# Patient Record
Sex: Male | Born: 1980 | Race: White | Hispanic: No | Marital: Married | Smoking: Never smoker
Health system: Southern US, Community
[De-identification: ages and names within clinical notes are randomized; demographics above are authoritative.]

## PROBLEM LIST (undated history)

## (undated) DIAGNOSIS — M542 Cervicalgia: Secondary | ICD-10-CM

## (undated) HISTORY — DX: Cervicalgia: M54.2

## (undated) HISTORY — PX: NO PAST SURGERIES: SHX2092

---

## 2016-09-13 ENCOUNTER — Encounter: Payer: Self-pay | Admitting: Physician Assistant

## 2016-09-13 ENCOUNTER — Ambulatory Visit (INDEPENDENT_AMBULATORY_CARE_PROVIDER_SITE_OTHER): Payer: 59 | Admitting: Physician Assistant

## 2016-09-13 ENCOUNTER — Ambulatory Visit (INDEPENDENT_AMBULATORY_CARE_PROVIDER_SITE_OTHER): Payer: 59

## 2016-09-13 VITALS — BP 127/84 | HR 80 | Ht 74.0 in | Wt 221.0 lb

## 2016-09-13 DIAGNOSIS — M25522 Pain in left elbow: Secondary | ICD-10-CM | POA: Insufficient documentation

## 2016-09-13 DIAGNOSIS — M542 Cervicalgia: Secondary | ICD-10-CM | POA: Diagnosis not present

## 2016-09-13 DIAGNOSIS — M5412 Radiculopathy, cervical region: Secondary | ICD-10-CM

## 2016-09-13 DIAGNOSIS — Z0000 Encounter for general adult medical examination without abnormal findings: Secondary | ICD-10-CM | POA: Diagnosis not present

## 2016-09-13 MED ORDER — PREDNISONE 50 MG PO TABS: ORAL_TABLET | ORAL | 0 refills | Status: DC

## 2016-09-13 MED ORDER — MELOXICAM 15 MG PO TABS: 15.0000 mg | ORAL_TABLET | Freq: Every day | ORAL | 0 refills | Status: DC

## 2016-09-13 NOTE — Patient Instructions (Signed)
I have ordered fasting labs (nothing to eat or drink except water after midnight or at least 8 hours before the blood draw). The lab is open 8a-5p and closed 12:30-1:30. No appointment needed   Cervical Radiculopathy Introduction Cervical radiculopathy happens when a nerve in the neck (cervical nerve) is pinched or bruised. This condition can develop because of an injury or as part of the normal aging process. Pressure on the cervical nerves can cause pain or numbness that runs from the neck all the way down into the arm and fingers. Usually, this condition gets better with rest. Treatment may be needed if the condition does not improve. What are the causes? This condition may be caused by:  Injury.  Slipped (herniated) disk.  Muscle tightness in the neck because of overuse.  Arthritis.  Breakdown or degeneration in the bones and joints of the spine (spondylosis) due to aging.  Bone spurs that may develop near the cervical nerves. What are the signs or symptoms? Symptoms of this condition include:  Pain that runs from the neck to the arm and hand. The pain can be severe or irritating. It may be worse when the neck is moved.  Numbness or weakness in the affected arm and hand. How is this diagnosed? This condition may be diagnosed based on symptoms, medical history, and a physical exam. You may also have tests, including:  X-rays.  CT scan.  MRI.  Electromyogram (EMG).  Nerve conduction tests. How is this treated? In many cases, treatment is not needed for this condition. With rest, the condition usually gets better over time. If treatment is needed, options may include:  Wearing a soft neck collar for short periods of time.  Physical therapy to strengthen your neck muscles.  Medicines, such as NSAIDs, oral corticosteroids, or spinal injections.  Surgery. This may be needed if other treatments do not help. Various types of surgery may be done depending on the cause of  your problems. Follow these instructions at home: Managing pain  Take over-the-counter and prescription medicines only as told by your health care provider.  If directed, apply ice to the affected area.  Put ice in a plastic bag.  Place a towel between your skin and the bag.  Leave the ice on for 20 minutes, 2-3 times per day.  If ice does not help, you can try using heat. Take a warm shower or warm bath, or use a heat pack as told by your health care provider.  Try a gentle neck and shoulder massage to help relieve symptoms. Activity  Rest as needed. Follow instructions from your health care provider about any restrictions on activities.  Do stretching and strengthening exercises as told by your health care provider or physical therapist. General instructions  If you were given a soft collar, wear it as told by your health care provider.  Use a flat pillow when you sleep.  Keep all follow-up visits as told by your health care provider. This is important. Contact a health care provider if:  Your condition does not improve with treatment. Get help right away if:  Your pain gets much worse and cannot be controlled with medicines.  You have weakness or numbness in your hand, arm, face, or leg.  You have a high fever.  You have a stiff, rigid neck.  You lose control of your bowels or your bladder (have incontinence).  You have trouble with walking, balance, or speaking. This information is not intended to replace advice given  to you by your health care provider. Make sure you discuss any questions you have with your health care provider. Document Released: 05/31/2001 Document Revised: 02/11/2016 Document Reviewed: 10/30/2014  2017 Elsevier

## 2016-09-13 NOTE — Progress Notes (Signed)
HPI:                                                                Kenneth Mays is a 35 y.o. male who [facility] today to establish care and c/o neck pain  Hx of elevated BP: has had elevated pressures in the past when visiting the doctors office. Has never taken antihypertensive medication.Denies headache, chest pain, dyspnea, lightheadedness, and edema. Exercises regularly (jiu Jitsu).   Neck pain: mostly left-sided, that is radiating into left shoulder with paresthesias in his left 4th and 5th digits. Started 1 month ago. Reports injury during Jiu Jitsu where his body weight and that of another 200 pound person was entirely on his neck. He has full ROM. Pain is worse with driving and certain positions while lying down.  Describes pain as burning sensation and "someone grabbing his muscles and squeezing as hard as they can." Has tried rest and heat. Ibuprofen provided no relief.    Left elbow pain: states approx. 1 month ago he was in a [employer] hold and felt/heard a loud pop in his elbow followed by pain. Denied any swelling, decreased ROM, or deformity. Continues to have elbow pain with flexion and extension.  Family hx significant for HTN and Diabetes.  Health Maintenance Health Maintenance  Topic Date Due  . HIV Screening  07/15/1996  . TETANUS/TDAP  07/15/2000  . INFLUENZA VACCINE  04/19/2016    Health Habits  Diet: regular  Exercise: yes, Jiu Jitsu  ETOH: no  Tobacco: no  Drugs: no  Dental Exam: up to date  Eye Exam: never  No past medical history on file. No past surgical history on file. Social History  Substance Use Topics  . Smoking status: Never Smoker  . Smokeless tobacco: Not on file  . Alcohol use No   family history is not on file.  Review of Systems  Constitutional: Negative for chills, fever, malaise/fatigue and weight loss.  HENT: Negative.   Eyes: Negative.   Respiratory:  Negative.   Cardiovascular: Negative.   Gastrointestinal: Negative.   Genitourinary: Negative.   Musculoskeletal: Positive for joint pain (left elbow) and neck pain.  Skin: Negative.   Neurological: Positive for tingling (left 4th and 5th digits) and sensory change (left 4th and 5th digits). Negative for focal weakness, loss of consciousness and headaches.  Endo/Heme/Allergies: Negative.   Psychiatric/Behavioral: Negative for depression. The patient is not nervous/anxious.     Medications: Current Outpatient Prescriptions  Medication Sig Dispense Refill  . meloxicam (MOBIC) 15 MG tablet Take 1 tablet (15 mg total) by mouth daily. 30 tablet 0  . predniSONE (DELTASONE) 50 MG tablet One tab PO daily for 5 days. 5 tablet 0   No current facility-administered medications for this visit.    No Known Allergies     Objective:  BP 127/84   Pulse 80   Ht 6\' 2"  (1.88 m)   Wt 221 lb (100.2 kg)   BMI 28.37 kg/m   Gen: Alert, not ill-appearing, no distress HEENT:  normal conjunctiva, TM's clear, oropharynx clear, moist mucus membranes, no thyromegaly or tenderness Lungs: Normal work of breathing, clear to auscultation bilaterally Heart: Normal rate, regular rhythm, s1 and s2 distinct, no murmurs,  clicks or rubs appreciated on this exam Abd: Soft. Nondistended, Nontender Extremities: distal pulses intact, no peripheral edema Neuro: alert and oriented x 3, EOM's intact, PERRLA,  Musculoskeletal: strength 5/5 and symmetric in upper extremities, full ROM of b/l upper extremities, no point tenderness at a/c joint, no spinous process tenderness, left elbow without deformity and with full ROM, normal gait Skin: warm and dry, no rashes or lesions on exposed skin Psych: normal affect, pleasant mood, normal speech   No results found for this or any previous visit (from the past 72 hour(s)). Dg Cervical Spine Complete  Result Date: 09/13/2016 CLINICAL DATA:  Neck pain, no acute injury EXAM:  CERVICAL SPINE - COMPLETE 4+ VIEW COMPARISON:  None. FINDINGS: The cervical vertebrae are in normal alignment. Intervertebral disc spaces appear normal. No prevertebral soft tissue swelling is seen. On oblique views, the foramina are patent. The odontoid process is intact. IMPRESSION: Normal alignment.  Normal intervertebral disc spaces. Electronically Signed   By: Dwyane DeePaul  Barry M.D.   On: 09/13/2016 16:34   Dg Elbow Complete Left (3+view)  Result Date: 09/13/2016 CLINICAL DATA:  Left elbow pain for 2 weeks, no injury EXAM: LEFT ELBOW - COMPLETE 3+ VIEW COMPARISON:  None. FINDINGS: Alignment is normal. No fracture is seen. No joint effusion is noted. There is a small well corticated bony density along the medial aspect of the elbow joint which could be due to prior trauma. IMPRESSION: No fracture. No joint effusion. Possible small avulsion from prior trauma as noted above. Electronically Signed   By: Dwyane DeePaul  Barry M.D.   On: 09/13/2016 16:36      Assessment and Plan: 35 y.o. male with   1. Encounter for preventative adult health care examination - CBC - Comprehensive metabolic panel - Hemoglobin A1c - Lipid panel - TSH - VITAMIN D 25 Hydroxy (Vit-D Deficiency, Fractures)  2. Neck pain with cervical radiculopathy - DG Cervical Spine Complete; Future - meloxicam (MOBIC) 15 MG tablet; Take 1 tablet (15 mg total) by mouth daily. - predniSONE (DELTASONE) 50 MG tablet; One tab PO daily for 5 days - referred to Sports Medicine - instructed to avoid [employer] El[employer]iu Jitsu until imaging studies complete and he has been evaluated by Sports Medicine  3. Left elbow pain - DG ELBOW COMPLETE LEFT (3+VIEW); Future - meloxicam (MOBIC) 15 MG  - predniSONE (DELTASONE) 50 MG  - referred to Sports Medicine  4. Elevated BP - mildly elevated diastolic BP in clinic today - instructed on lifestyle modifications   Patient education and anticipatory guidance given Patient agrees with treatment plan Follow-up in 1 year  or sooner as needed  Levonne Hubertharley E[employer] PA-C

## 2016-09-14 ENCOUNTER — Encounter: Payer: Self-pay | Admitting: Physician Assistant

## 2016-09-14 DIAGNOSIS — M5412 Radiculopathy, cervical region: Secondary | ICD-10-CM | POA: Insufficient documentation

## 2016-09-26 ENCOUNTER — Ambulatory Visit (INDEPENDENT_AMBULATORY_CARE_PROVIDER_SITE_OTHER): Payer: 59 | Admitting: Sports Medicine

## 2016-09-26 ENCOUNTER — Encounter: Payer: Self-pay | Admitting: Sports Medicine

## 2016-09-26 DIAGNOSIS — M5412 Radiculopathy, cervical region: Secondary | ICD-10-CM

## 2016-09-26 MED ORDER — PREDNISONE 10 MG (48) PO TBPK: ORAL_TABLET | Freq: Every day | ORAL | 0 refills | Status: DC

## 2016-09-26 NOTE — Progress Notes (Signed)
   Subjective:    I'm seeing this patient as a consultation for:  Kenneth Mays Cummings, PA-C  CC: Neck and left arm pain  HPI: This is a pleasant 36 year old male, he has had severe neck pain with radiation down the left arm with weakness in the triceps and a burning sensation to the second and third fingers. He was appropriately placed on prednisone by Vinetta Bergamoharley, as well as meloxicam and given some rehabilitation exercises. Over the past several days to weeks he has noted fantastic/greater than 90% improvement in his neck pain, as well as his left arm radicular-type burning pain. He continues to improve. Symptoms are moderate, improving. Weakness also continues to improve.  Past medical history:  Negative.  See flowsheet/record as well for more information.  Surgical history: Negative.  See flowsheet/record as well for more information.  Family history: Negative.  See flowsheet/record as well for more information.  Social history: Negative.  See flowsheet/record as well for more information.  Allergies, and medications have been entered into the medical record, reviewed, and no changes needed.   Review of Systems: No headache, visual changes, nausea, vomiting, diarrhea, constipation, dizziness, abdominal pain, skin rash, fevers, chills, night sweats, weight loss, swollen lymph nodes, body aches, joint swelling, muscle aches, chest pain, shortness of breath, mood changes, visual or auditory hallucinations.   Objective:   General: Well Developed, well nourished, and in no acute distress.  Neuro/Psych: Alert and oriented x3, extra-ocular muscles intact, able to move all 4 extremities, sensation grossly intact. Skin: Warm and dry, no rashes noted.  Respiratory: Not using accessory muscles, speaking in full sentences, trachea midline.  Cardiovascular: Pulses palpable, no extremity edema. Abdomen: Does not appear distended. Neck: Negative spurling's Full neck range of motion Grip strength and  sensation normal in bilateral hands Strength good C4 to T1 distribution No sensory change to C4 to T1 Triceps reflexes hypoactive on the left, also with some triceps weakness on the left.  Impression and Recommendations:   This case required medical decision making of moderate complexity.  Left cervical radiculopathy With C7 distribution symptoms and left triceps weakness.  Improving significantly, 90% better in terms of both sensory symptoms and strength. Adding an additional prednisone taper, emphasized the importance of exercises specific to the neck. He will avoid deadlifts and really any lifting greater than 20 pounds at work or at play. He takes going to avoid jujitsu for now. We can probably get him back into it in a month. Return to see me in one month.

## 2016-09-26 NOTE — Assessment & Plan Note (Signed)
With C7 distribution symptoms and left triceps weakness.  Improving significantly, 90% better in terms of both sensory symptoms and strength. Adding an additional prednisone taper, emphasized the importance of exercises specific to the neck. He will avoid deadlifts and really any lifting greater than 20 pounds at work or at play. He takes going to avoid jujitsu for now. We can probably get him back into it in a month. Return to see me in one month.

## 2016-10-14 ENCOUNTER — Emergency Department (INDEPENDENT_AMBULATORY_CARE_PROVIDER_SITE_OTHER): Payer: 59

## 2016-10-14 ENCOUNTER — Encounter: Payer: Self-pay | Admitting: *Deleted

## 2016-10-14 ENCOUNTER — Emergency Department (INDEPENDENT_AMBULATORY_CARE_PROVIDER_SITE_OTHER)
Admission: EM | Admit: 2016-10-14 | Discharge: 2016-10-14 | Disposition: A | Discharge status: Home / Self Care | Payer: 59 | Source: Home / Self Care | Attending: Family Medicine | Admitting: Family Medicine

## 2016-10-14 DIAGNOSIS — R509 Fever, unspecified: Secondary | ICD-10-CM | POA: Diagnosis not present

## 2016-10-14 DIAGNOSIS — R6889 Other general symptoms and signs: Secondary | ICD-10-CM | POA: Diagnosis not present

## 2016-10-14 DIAGNOSIS — M791 Myalgia: Secondary | ICD-10-CM | POA: Diagnosis not present

## 2016-10-14 MED ORDER — IBUPROFEN 600 MG PO TABS
600.0000 mg | ORAL_TABLET | Freq: Once | ORAL | Status: AC
  Administered 2016-10-14: 600 mg via ORAL

## 2016-10-14 MED ORDER — OSELTAMIVIR PHOSPHATE 75 MG PO CAPS: 75.0000 mg | ORAL_CAPSULE | Freq: Two times a day (BID) | ORAL | 0 refills | Status: DC

## 2016-10-14 MED ORDER — BENZONATATE 100 MG PO CAPS: 100.0000 mg | ORAL_CAPSULE | Freq: Three times a day (TID) | ORAL | 0 refills | Status: DC

## 2016-10-14 NOTE — ED Triage Notes (Signed)
Patient c/o fatigue fever, aches, chills, cough with white sputum x yesterday/ No flu vac this season.

## 2016-10-14 NOTE — Discharge Instructions (Signed)

## 2016-10-14 NOTE — ED Provider Notes (Signed)
CSN: [id number]     Arrival date & time 10/14/16  4098 History   First MD Initiated Contact with Patient 10/14/16 [id number]     Chief Complaint  Patient presents with  . Generalized Body Aches  . Cough  . Fatigue   (Consider location/radiation/quality/duration/timing/severity/associated sxs/prior Treatment) HPI  Kenneth Mays is a 36 y.o. male presenting to UC with c/o 2 days of sudden onset flu-like symptoms, fever, chills, body aches, productive cough.  Fever Tmax 102*F last night. He has taken Dayquil and ibuprofen with minimal relief. No sick contacts. Denies vomiting or diarrhea. He did not get the flu vaccine this year. No hx of asthma. Denies chest pain or SOB.   History reviewed. No pertinent past medical history. History reviewed. No pertinent surgical history. History reviewed. No pertinent family history. Social History  Substance Use Topics  . Smoking status: Never Smoker  . Smokeless tobacco: Never Used  . Alcohol use No    Review of Systems  Constitutional: Positive for chills, fatigue and fever.  HENT: Positive for congestion. Negative for ear pain, sore throat, trouble swallowing and voice change.   Respiratory: Positive for cough. Negative for shortness of breath.   Cardiovascular: Negative for chest pain and palpitations.  Gastrointestinal: Positive for nausea. Negative for abdominal pain, diarrhea and vomiting.  Musculoskeletal: Positive for arthralgias and myalgias. Negative for back pain.  Skin: Negative for rash.    Allergies  Patient has no known allergies.  Home Medications   Prior to Admission medications   Medication Sig Start Date End Date Taking? Authorizing Provider  benzonatate (TESSALON) 100 MG capsule Take 1-2 capsules (100-200 mg total) by mouth every 8 (eight) hours. 10/14/16   Junius Finner, PA-C  meloxicam (MOBIC) 15 MG tablet Take 1 tablet (15 mg total) by mouth daily. 09/13/16   Carlis Stable, PA-C  oseltamivir (TAMIFLU) 75 MG  capsule Take 1 capsule (75 mg total) by mouth every 12 (twelve) hours. 10/14/16   Junius Finner, PA-C   Meds Ordered and Administered this Visit   Medications  ibuprofen (ADVIL,MOTRIN) tablet 600 mg (600 mg Oral Given 10/14/16 0928)    BP 120/80 (BP Location: Left Arm)   Pulse (!) 125   Temp 101.8 F (38.8 C) (Oral)   Resp 16   Ht 6' (1.829 m)   Wt 214 lb (97.1 kg)   SpO2 97%   BMI 29.02 kg/m  No data found.   Physical Exam  Constitutional: He is oriented to person, place, and time. He appears well-developed and well-nourished. No distress.  HENT:  Head: Normocephalic and atraumatic.  Right Ear: Tympanic membrane normal.  Left Ear: Tympanic membrane normal.  Nose: Nose normal.  Mouth/Throat: Uvula is midline and mucous membranes are normal. Posterior oropharyngeal erythema present. No oropharyngeal exudate, posterior oropharyngeal edema or tonsillar abscesses.  Eyes: EOM are normal.  Neck: Normal range of motion. Neck supple.  Cardiovascular: Regular rhythm.  Tachycardia present.   Mild tachycardia on exam  Pulmonary/Chest: Effort normal. No stridor. No respiratory distress. He has decreased breath sounds in the left lower field. He has no wheezes. He has no rales.  Musculoskeletal: Normal range of motion.  Lymphadenopathy:    He has no cervical adenopathy.  Neurological: He is alert and oriented to person, place, and time.  Skin: Skin is warm and dry. He is not diaphoretic.  Psychiatric: He has a normal mood and affect. His behavior is normal.  Nursing note and vitals reviewed.   Urgent Care Course  Procedures (including critical care time)  Labs Review Labs Reviewed - No data to display  Imaging Review Dg Chest 2 View  Result Date: 10/14/2016 CLINICAL DATA:  Fever, body aches x2 days EXAM: CHEST  2 VIEW COMPARISON:  None. FINDINGS: Lungs are clear.  No pleural effusion or pneumothorax. The heart is normal in size. Visualized osseous structures are within  normal limits. IMPRESSION: Normal chest radiographs. Electronically Signed   By: Charline BillsSriyesh  Krishnan M.D.   On: 10/14/2016 09:55     MDM   1. Flu-like symptoms    Pt c/o 2 days of flu-like symptoms.  No flu vaccine this year.   Temp 101.8*F pulse- 125bpm Ibuprofen and PO fluids given in UC Vitals improved while in UC: Temp- 99.5*F, pulse- 109 on recheck.   CXR: No evidence of pneumonia   Discussed risks/benefits of Tamiflu. Pt would like to try the treatment. Rx: Tamiflu and tessalon  Work note provided for pt to return Tuesday, 1/30.  If not improving, encouraged f/u with PCP.  If worsening this weekend, go to ED.      Junius Finnerrin O'Malley, PA-C 10/14/16 1048

## 2016-10-27 ENCOUNTER — Ambulatory Visit (INDEPENDENT_AMBULATORY_CARE_PROVIDER_SITE_OTHER): Payer: 59 | Admitting: Sports Medicine

## 2016-10-27 DIAGNOSIS — M5412 Radiculopathy, cervical region: Secondary | ICD-10-CM

## 2016-10-27 DIAGNOSIS — G5602 Carpal tunnel syndrome, left upper limb: Secondary | ICD-10-CM | POA: Diagnosis not present

## 2016-10-27 NOTE — Assessment & Plan Note (Signed)
Symptoms completed a resolved with conservative measures

## 2016-10-27 NOTE — Assessment & Plan Note (Signed)
Overt left cervical radicular symptoms have resolved.  He does complain of numbness of the entire hand only in the mornings. He will wear a Velcro splint at night, and return to see me if symptoms are not better in 4 weeks.

## 2016-10-27 NOTE — Progress Notes (Signed)
  Subjective:    CC: follow-up  HPI: This is a pleasant 36 year old male our shortest, he has done extremely well and is essentially pain free with regards to his previous cervical radiculopathy. He does describe occasional left hand numbness, nothing in the elbow, forearm, or shoulder. This only occurs at night and in the morning, and resolves when he wakes up. Symptoms are mild, persistent.  Past medical history:  Negative.  See flowsheet/record as well for more information.  Surgical history: Negative.  See flowsheet/record as well for more information.  Family history: Negative.  See flowsheet/record as well for more information.  Social history: Negative.  See flowsheet/record as well for more information.  Allergies, and medications have been entered into the medical record, reviewed, and no changes needed.   Review of Systems: No fevers, chills, night sweats, weight loss, chest pain, or shortness of breath.   Objective:    General: Well Developed, well nourished, and in no acute distress.  Neuro: Alert and oriented x3, extra-ocular muscles intact, sensation grossly intact.  HEENT: Normocephalic, atraumatic, pupils equal round reactive to light, neck supple, no masses, no lymphadenopathy, thyroid nonpalpable.  Skin: Warm and dry, no rashes. Cardiac: Regular rate and rhythm, no murmurs rubs or gallops, no lower extremity edema.  Respiratory: Clear to auscultation bilaterally. Not using accessory muscles, speaking in full sentences.  Impression and Recommendations:    Left cervical radiculopathy Symptoms completed a resolved with conservative measures  Left carpal tunnel syndrome Overt left cervical radicular symptoms have resolved.  He does complain of numbness of the entire hand only in the mornings. He will wear a Velcro splint at night, and return to see me if symptoms are not better in 4 weeks.

## 2016-11-10 ENCOUNTER — Ambulatory Visit
Admission: RE | Admit: 2016-11-10 | Discharge: 2016-11-10 | Disposition: A | Discharge status: Home / Self Care | Payer: 59 | Source: Ambulatory Visit | Attending: Emergency Medicine | Admitting: Emergency Medicine

## 2016-11-10 ENCOUNTER — Other Ambulatory Visit: Payer: Self-pay | Admitting: Emergency Medicine

## 2016-11-10 DIAGNOSIS — N200 Calculus of kidney: Secondary | ICD-10-CM

## 2016-11-11 ENCOUNTER — Ambulatory Visit (INDEPENDENT_AMBULATORY_CARE_PROVIDER_SITE_OTHER): Payer: 59 | Admitting: Sports Medicine

## 2016-11-11 ENCOUNTER — Ambulatory Visit (INDEPENDENT_AMBULATORY_CARE_PROVIDER_SITE_OTHER): Payer: 59

## 2016-11-11 DIAGNOSIS — J9811 Atelectasis: Secondary | ICD-10-CM | POA: Diagnosis not present

## 2016-11-11 DIAGNOSIS — M546 Pain in thoracic spine: Secondary | ICD-10-CM | POA: Diagnosis not present

## 2016-11-11 DIAGNOSIS — M549 Dorsalgia, unspecified: Secondary | ICD-10-CM | POA: Insufficient documentation

## 2016-11-11 MED ORDER — CYCLOBENZAPRINE HCL 10 MG PO TABS: ORAL_TABLET | ORAL | 0 refills | Status: DC

## 2016-11-11 MED ORDER — PREDNISONE 50 MG PO TABS: ORAL_TABLET | ORAL | 0 refills | Status: DC

## 2016-11-11 NOTE — Assessment & Plan Note (Signed)
Acute thoracic strain, quadratus lumborum strain. Prednisone, rehabilitation exercises, Flexeril at bedtime. Right-sided rib series. Return in 2 weeks.

## 2016-11-11 NOTE — Progress Notes (Deleted)
HPI:                                                                Marcie MowersDavid Dercole is a 36 y.o. male who [facility] [facility] today for testicular pain    No past medical history on file. No past surgical history on file. Social History  Substance Use Topics  . Smoking status: Never Smoker  . Smokeless tobacco: Never Used  . Alcohol use No   family history is not on file.  ROS: negative except as noted in the HPI  Medications: Current Outpatient Prescriptions  Medication Sig Dispense Refill  . meloxicam (MOBIC) 15 MG tablet Take 1 tablet (15 mg total) by mouth daily. (Patient not taking: Reported on 10/27/2016) 30 tablet 0   No current facility-administered medications for this visit.    No Known Allergies     Objective:  There were no vitals taken for this visit. Gen: well-groomed, cooperative, not ill-appearing, no distress HEENT: normal conjunctiva, TM's ***, nasal mucosa ***, oropharynx clear, moist mucus membranes, no frontal or maxillary sinus tenderness Pulm: Normal work of breathing, normal phonation, clear to auscultation bilaterally, no wheezes, rales or rhonchi CV: Normal rate, regular rhythm, s1 and s2 distinct, no murmurs, clicks or rubs  GI: soft, nondistended, nontender Neuro: alert and oriented x 3, EOM's intact Lymph: no cervical or tonsillar adenopathy Skin: warm and dry, no rashes or lesions on exposed skin, no cyanosis   No results found for this or any previous visit (from the past 72 hour(s)). Ct Renal Stone Study  Result Date: 11/10/2016 CLINICAL DATA:  Right flank pain EXAM: CT ABDOMEN AND PELVIS WITHOUT CONTRAST TECHNIQUE: Multidetector CT imaging of the abdomen and pelvis was performed following the standard protocol without oral or intravenous contrast material administration. COMPARISON:  None. FINDINGS: Lower chest: There is patchy bibasilar atelectasis, more on the right than on the left.  There may be early pneumonia in the posterior right base. Hepatobiliary: No focal liver lesions are apparent on this noncontrast enhanced study. Gallbladder wall is not appreciably thickened. There is no biliary duct dilatation. Pancreas: No pancreatic mass or inflammatory focus. Spleen: No splenic lesions evident. Adrenals/Urinary Tract: Adrenals appear normal bilaterally. Kidneys bilaterally show no appreciable mass or hydronephrosis on either side. There is minimal nephrocalcinosis bilaterally. There is no appreciable intrarenal or ureteral calculus on either side. Urinary bladder is midline with wall thickness within normal limits. Stomach/Bowel: There are scattered small sigmoid diverticula without diverticulitis. There is no bowel wall or mesenteric thickening. No bowel obstruction. No free air or portal venous air. Vascular/Lymphatic: There is no abdominal aortic aneurysm. No vascular lesions are evident on this noncontrast enhanced study. There is no adenopathy in the abdomen or pelvis. Reproductive: Prostate and seminal vesicles appear normal in size and contour. There is no pelvic mass. Other: Appendix appears normal. There is no ascites or abscess in the abdomen or pelvis. There is a minimal ventral hernia containing only fat. Musculoskeletal: There are no blastic or lytic bone lesions. There is no abdominal wall or intramuscular lesion. IMPRESSION: Patchy bibasilar atelectasis with probable right base superimposed pneumonia. No renal or ureteral calculus. No hydronephrosis. Slight nephrocalcinosis bilaterally. No bowel wall thickening or bowel obstruction. No abscess.  Appendix appears normal. Minimal ventral hernia containing only fat. Electronically Signed   By: Bretta Bang III M.D.   On: 11/10/2016 15:55      Assessment and Plan: 36 y.o. male with ***    Patient education and anticipatory guidance given Patient agrees with treatment plan Follow-up as needed if symptoms worsen or fail  to improve  Levonne Hubert PA-C

## 2016-11-11 NOTE — Progress Notes (Signed)
   Subjective:    I'm seeing this patient as a consultation for:  Kenneth Frayharley Cummings, PA-C  CC: Right-sided back pain  HPI: This is a pleasant 35 rolled male, several days ago he was lifting a heavy object, immediately he felt pain in his back with radiation around the right hemithorax, and a bit down to the groin. He was seen by his, Dr., he had x-rays and an abdominal/pelvic CT done that were negative, he had a urinalysis that was negative. He is referred here for further evaluation and definitive treatment. He does get some pain with deep breathing, no dysuria, no hematuria, no bowel or bladder dysfunction, saddle numbness. Reproduction of pain with coughing, palpation.  Past medical history:  Negative.  See flowsheet/record as well for more information.  Surgical history: Negative.  See flowsheet/record as well for more information.  Family history: Negative.  See flowsheet/record as well for more information.  Social history: Negative.  See flowsheet/record as well for more information.  Allergies, and medications have been entered into the medical record, reviewed, and no changes needed.   Review of Systems: No headache, visual changes, nausea, vomiting, diarrhea, constipation, dizziness, abdominal pain, skin rash, fevers, chills, night sweats, weight loss, swollen lymph nodes, body aches, joint swelling, muscle aches, chest pain, shortness of breath, mood changes, visual or auditory hallucinations.   Objective:   General: Well Developed, well nourished, and in no acute distress.  Neuro/Psych: Alert and oriented x3, extra-ocular muscles intact, able to move all 4 extremities, sensation grossly intact. Skin: Warm and dry, no rashes noted.  Respiratory: Not using accessory muscles, speaking in full sentences, trachea midline.  Cardiovascular: Pulses palpable, no extremity edema. Abdomen: Does not appear distended. Back Exam:  Inspection: Unremarkable  Motion: Flexion 45 deg, Extension 45  deg, Side Bending to 45 deg bilaterally,  Rotation to 45 deg bilaterally  SLR laying: Negative  XSLR laying: Negative  Palpable tenderness: Right sided intercostal muscles at the mid axillary line. FABER: negative. Sensory change: Gross sensation intact to all lumbar and sacral dermatomes.  Reflexes: 2+ at both patellar tendons, 2+ at achilles tendons, Babinski's downgoing.  Strength at foot  Plantar-flexion: 5/5 Dorsi-flexion: 5/5 Eversion: 5/5 Inversion: 5/5  Leg strength  Quad: 5/5 Hamstring: 5/5 Hip flexor: 5/5 Hip abductors: 5/5  Gait unremarkable.  Rib series shows no evidence of fracture, there is a bit of bibasilar atelectasis likely from splinting of breathing.  Impression and Recommendations:   This case required medical decision making of moderate complexity.  Back pain Acute thoracic strain, quadratus lumborum strain. Prednisone, rehabilitation exercises, Flexeril at bedtime. Right-sided rib series. Return in 2 weeks.

## 2016-12-14 ENCOUNTER — Encounter: Payer: Self-pay | Admitting: Physician Assistant

## 2016-12-14 ENCOUNTER — Ambulatory Visit (INDEPENDENT_AMBULATORY_CARE_PROVIDER_SITE_OTHER): Payer: 59 | Admitting: Physician Assistant

## 2016-12-14 VITALS — BP 125/83 | HR 82 | Wt 210.0 lb

## 2016-12-14 DIAGNOSIS — Z8739 Personal history of other diseases of the musculoskeletal system and connective tissue: Secondary | ICD-10-CM

## 2016-12-14 DIAGNOSIS — E663 Overweight: Secondary | ICD-10-CM

## 2016-12-14 NOTE — Progress Notes (Signed)
HPI:                                                                Marcie MowersDavid Rhee is a 36 y.o. male [facility] today for follow-up of back pain  Patient was seen by Sports medicine on 11/11/16 for acute quadratus lumborum strain. He was prescribed prednisone, rehab exercises and flexeril at bedtime. Rib xrays were negative. Patient reports he is pain-free and symptom-free today. He has not needed to use NSAIDs or muscle relaxants for the past 2 weeks.  Past Medical History:  Diagnosis Date  . Neck pain    Past Surgical History:  Procedure Laterality Date  . NO PAST SURGERIES     Social History  Substance Use Topics  . Smoking status: Never Smoker  . Smokeless tobacco: Never Used  . Alcohol use No   family history is not on file.  ROS: negative except as noted in the HPI  Medications: Current Outpatient Prescriptions  Medication Sig Dispense Refill  . cyclobenzaprine (FLEXERIL) 10 MG tablet 1/2-1 tab by mouth daily at bedtime 30 tablet 0   No current facility-administered medications for this visit.    No Known Allergies     Objective:  BP 125/83   Pulse 82   Wt 210 lb (95.3 kg)   BMI 28.48 kg/m  Gen: well-groomed, cooperative, not ill-appearing, no distress Pulm: Normal work of breathing, normal phonation Neuro: alert and oriented x 3, DTR's intact, normal tone, no tremor MSK: no spinous process tenderness or step-off deformity, no palpable spasm, full flexion, extension, lateral bend and rotation, normal gait and station Psych: appropriate affect, euthymic mood, normal speech  Depression screen PHQ 2/9 12/14/2016  Decreased Interest 0  Down, Depressed, Hopeless 0  PHQ - 2 Score 0   DG Ribs Unilateral Right W/Chest 11/11/2016 CLINICAL DATA:  36 y/o M; left-sided pain with concern for rib fracture.  EXAM: RIGHT RIBS AND CHEST - 3+ VIEW  COMPARISON:  None.  FINDINGS: No fracture or other  bone lesions are seen involving the ribs. There is no evidence of pneumothorax or pleural effusion. Linear opacities at lung bases compatible with minor atelectasis. Normal cardiac silhouette.  IMPRESSION: 1. Bibasilar minor atelectasis. 2. No acute fracture identified.   Electronically Signed   By: Mitzi HansenLance  Furusawa-Stratton M.D.   On: 11/11/2016 16:50  Assessment and Plan: 36 y.o. male with   Thoracic strain, resolved - reviewed xrays from 11/11/16, which were negative except for mild atelectasis - provided with note to return to work without restrictions - advised to take Ibuprofen 800mg  prn for pain  Patient education and anticipatory guidance given Patient agrees with treatment plan Follow-up as needed if symptoms worsen or fail to improve  Levonne Hubertharley E. Cummings PA-C

## 2018-08-13 IMAGING — DX DG CHEST 2V
2 series · 2 of 2 positions shown · non-contrast
Comparison: None.

CLINICAL DATA: Fever, body aches x2 days

EXAM:
CHEST  2 VIEW

[chest pa]
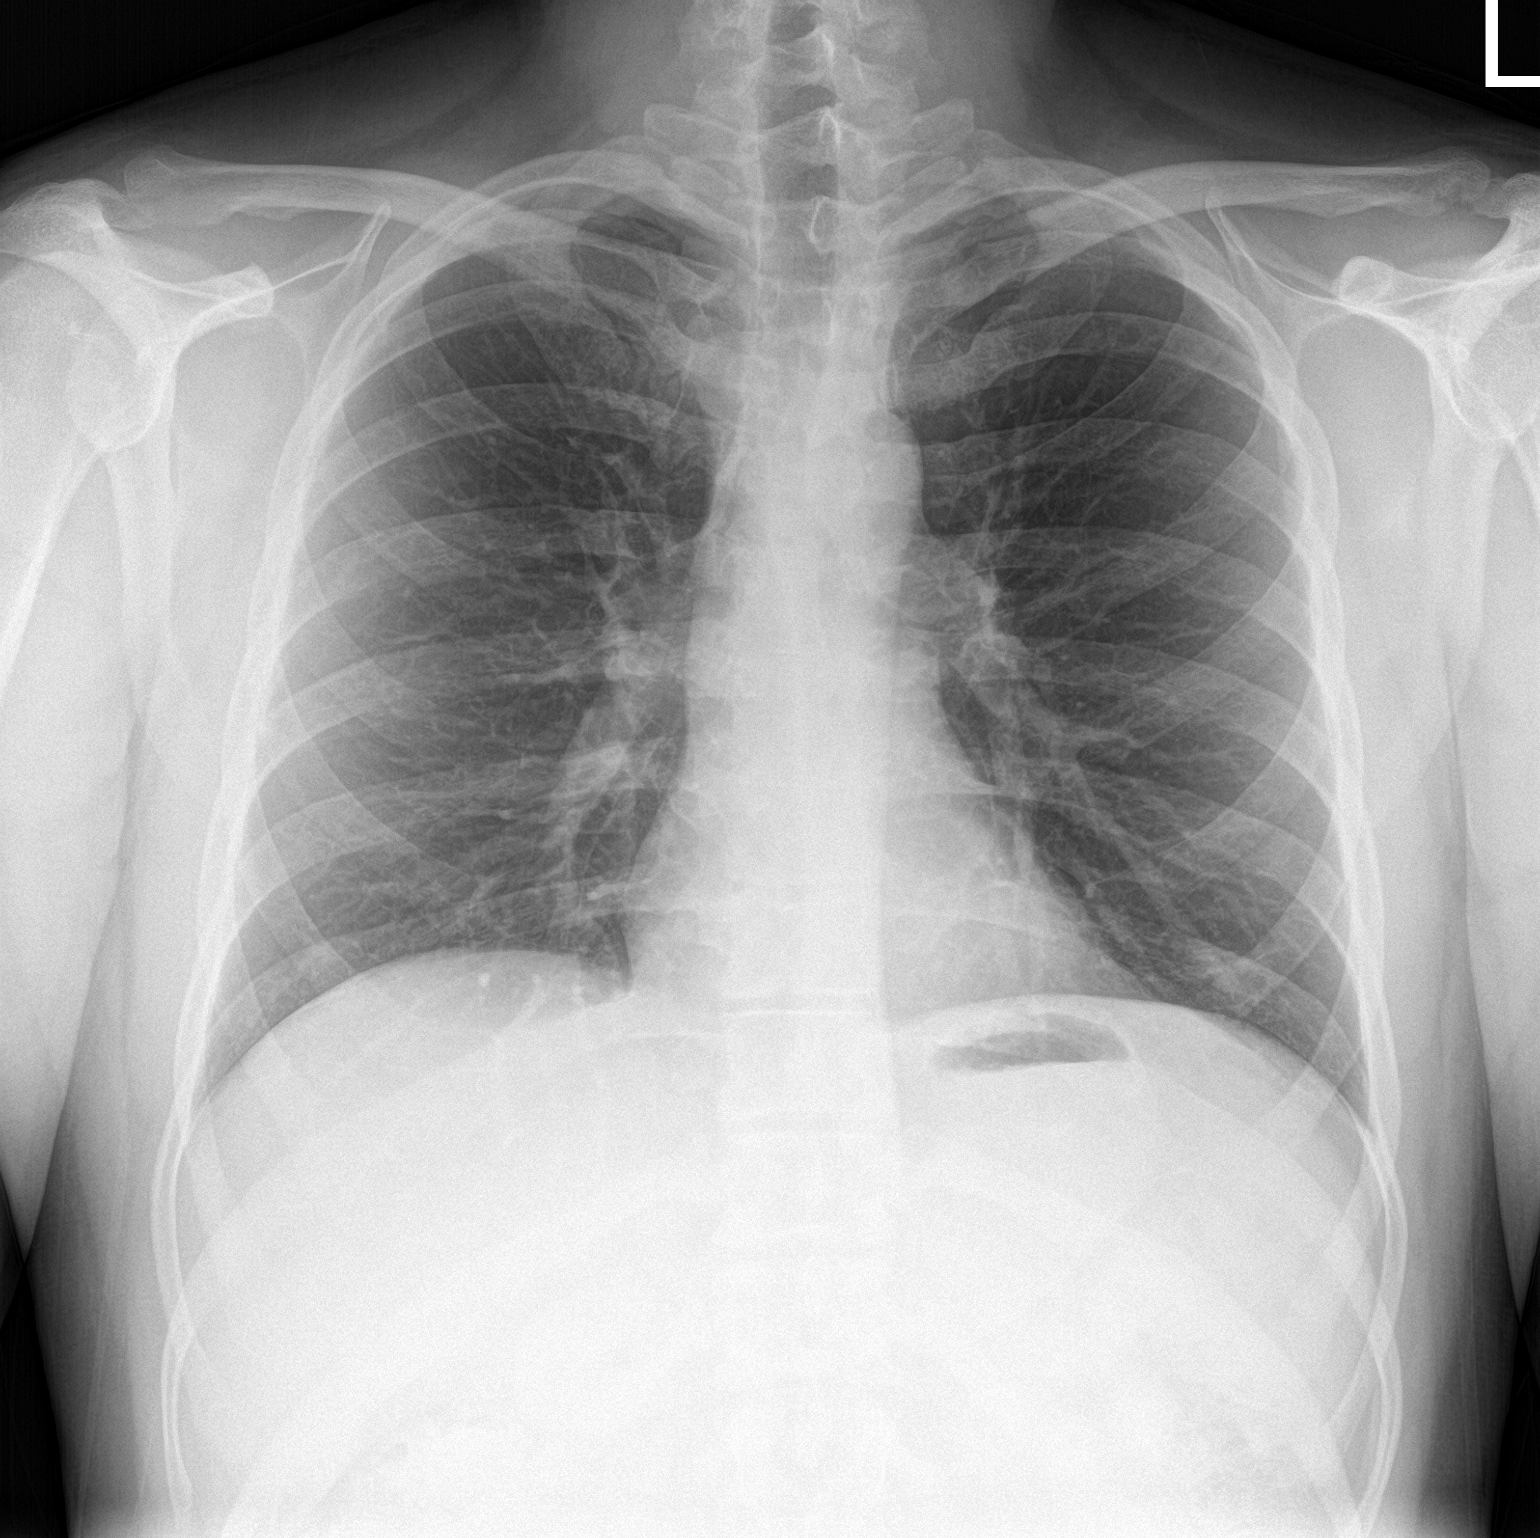

[chest lat]
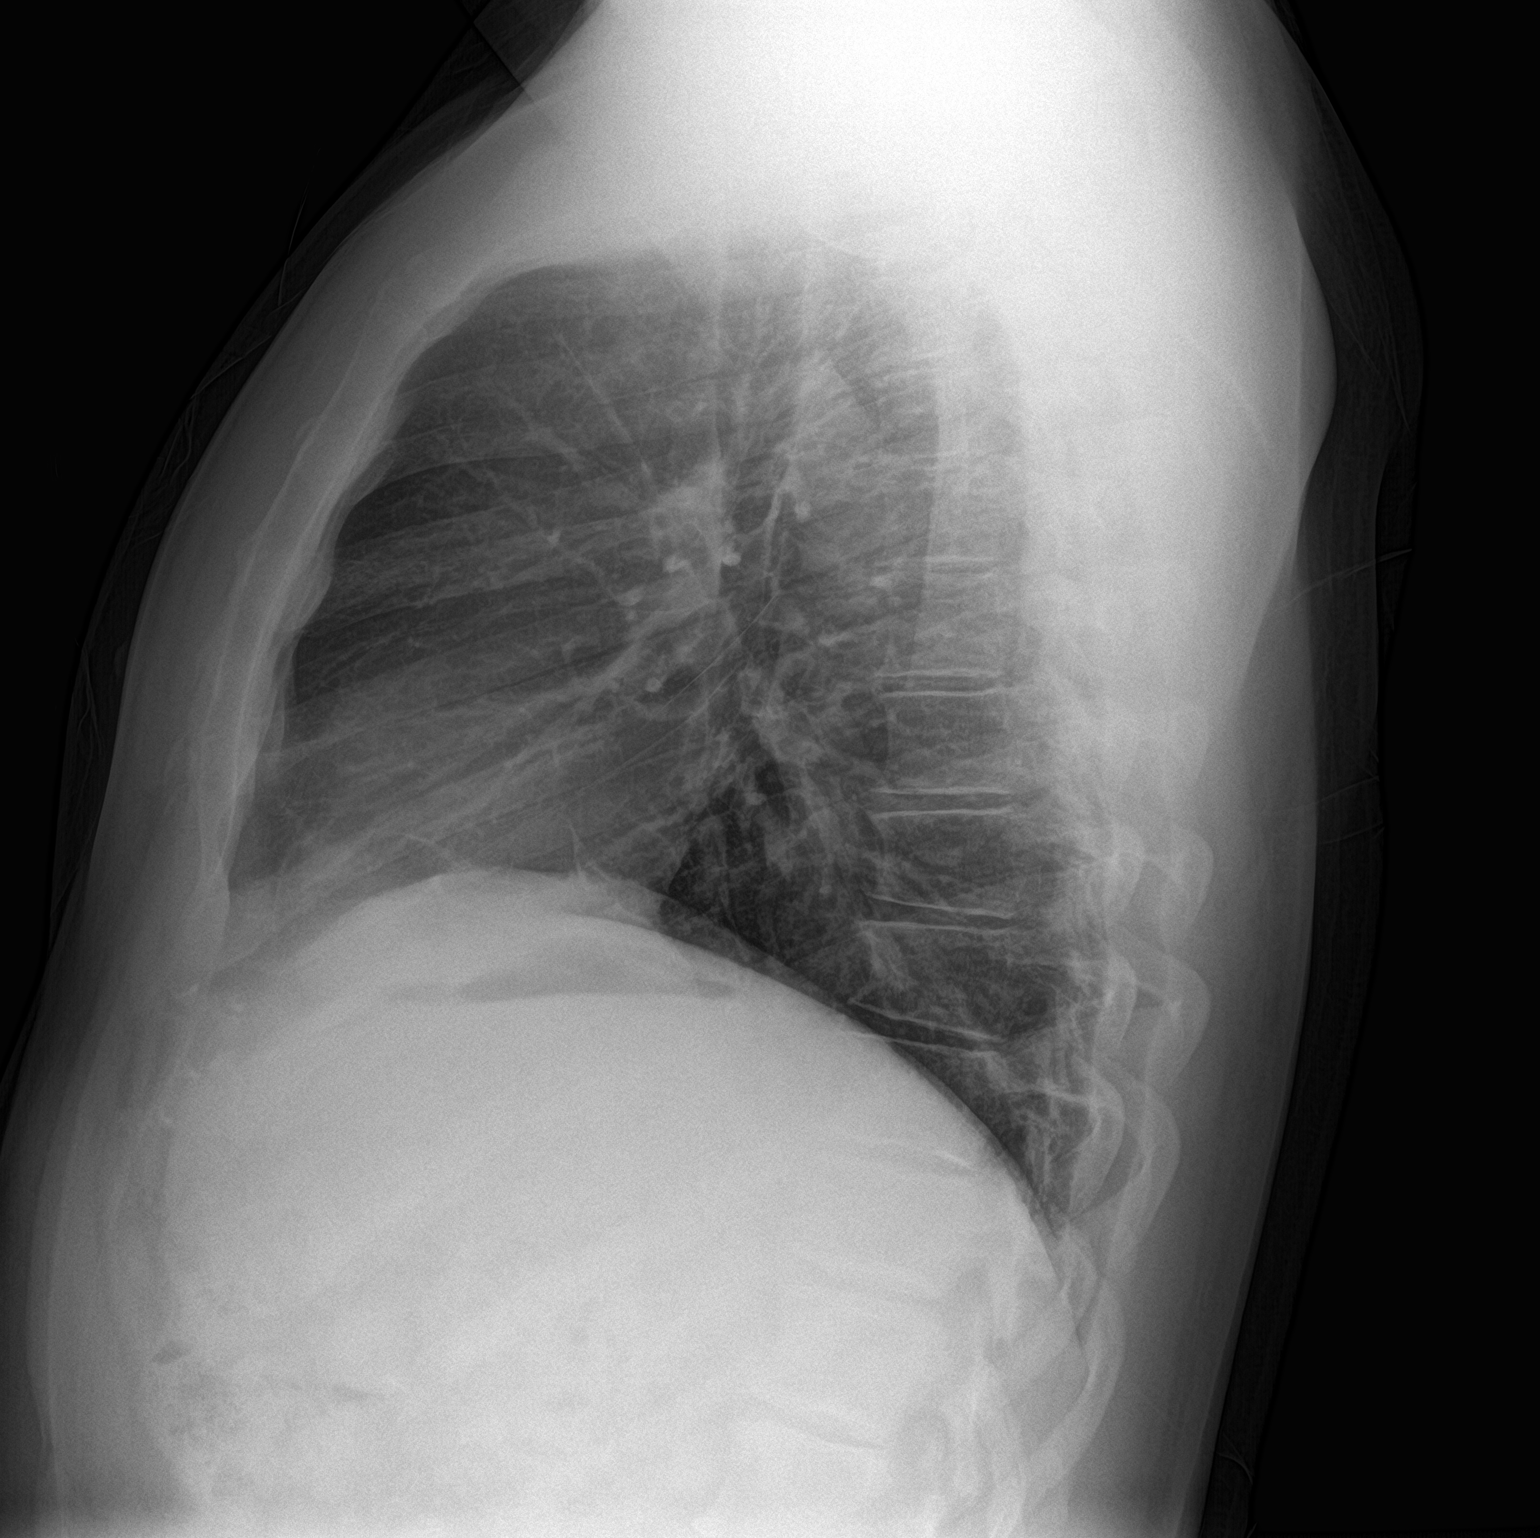

[2 of 2 positions shown; findings below may reference images not displayed]

FINDINGS: Lungs are clear.  No pleural effusion or pneumothorax.

The heart is normal in size.

Visualized osseous structures are within normal limits.
IMPRESSION: Normal chest radiographs.

## 2018-09-10 IMAGING — DX DG RIBS W/ CHEST 3+V*R*
3 series · 3 of 3 positions shown · non-contrast
Comparison: None.

CLINICAL DATA: 35 y/o M; left-sided pain with concern for rib
fracture.

EXAM:
RIGHT RIBS AND CHEST - 3+ VIEW

[chest pa]
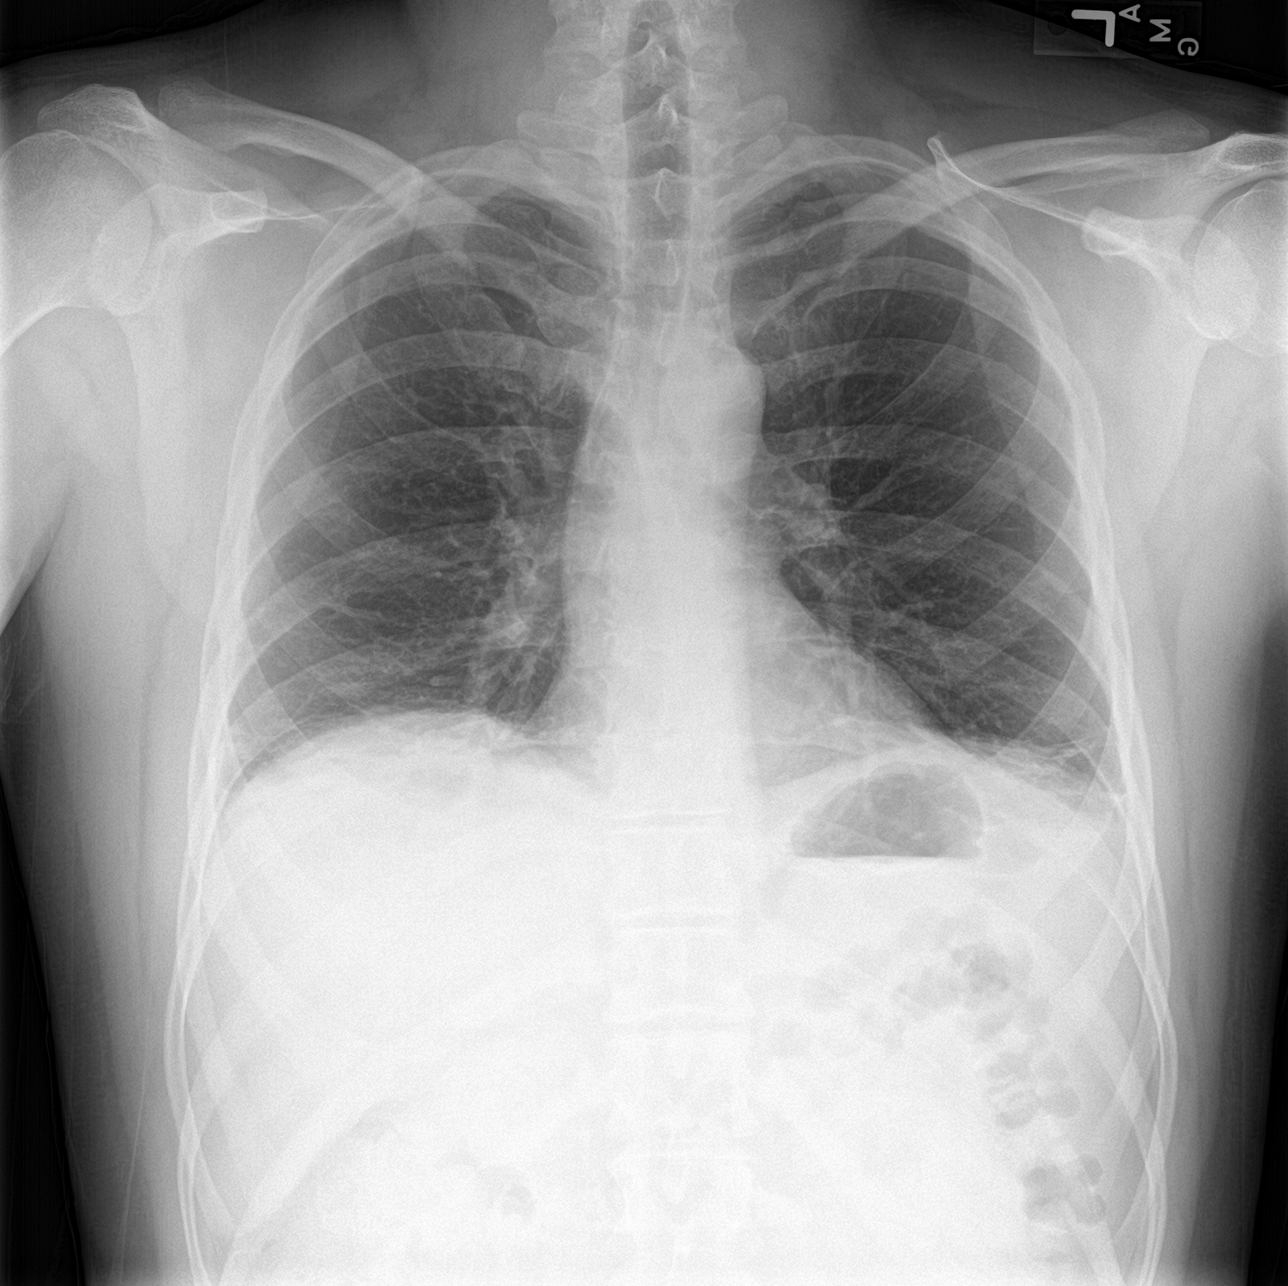

[rib ap (1 of 2)]
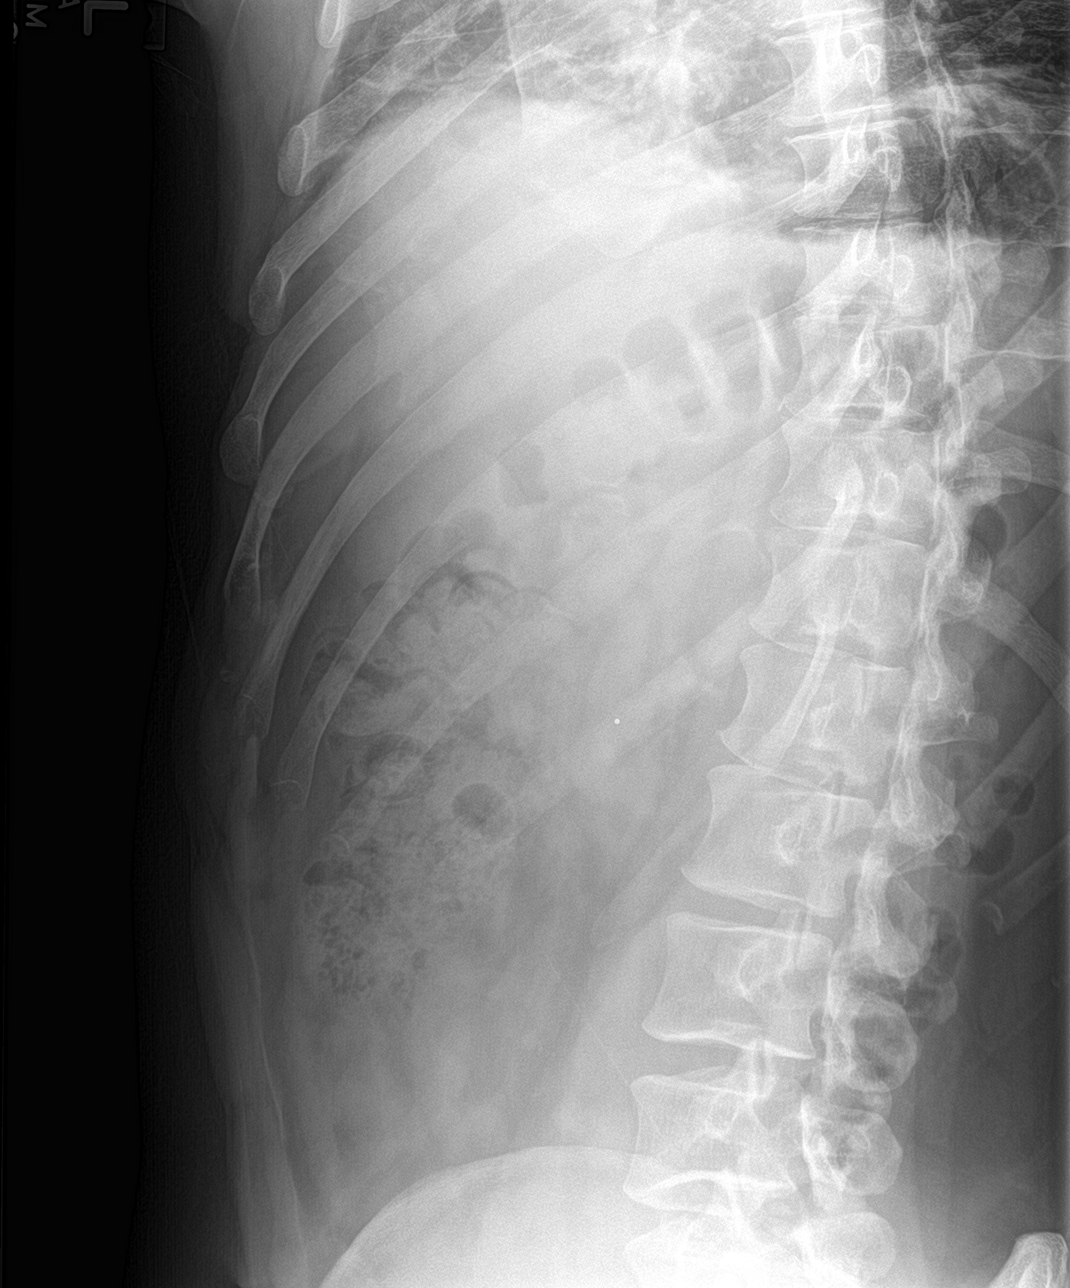

[rib ap (2 of 2)]
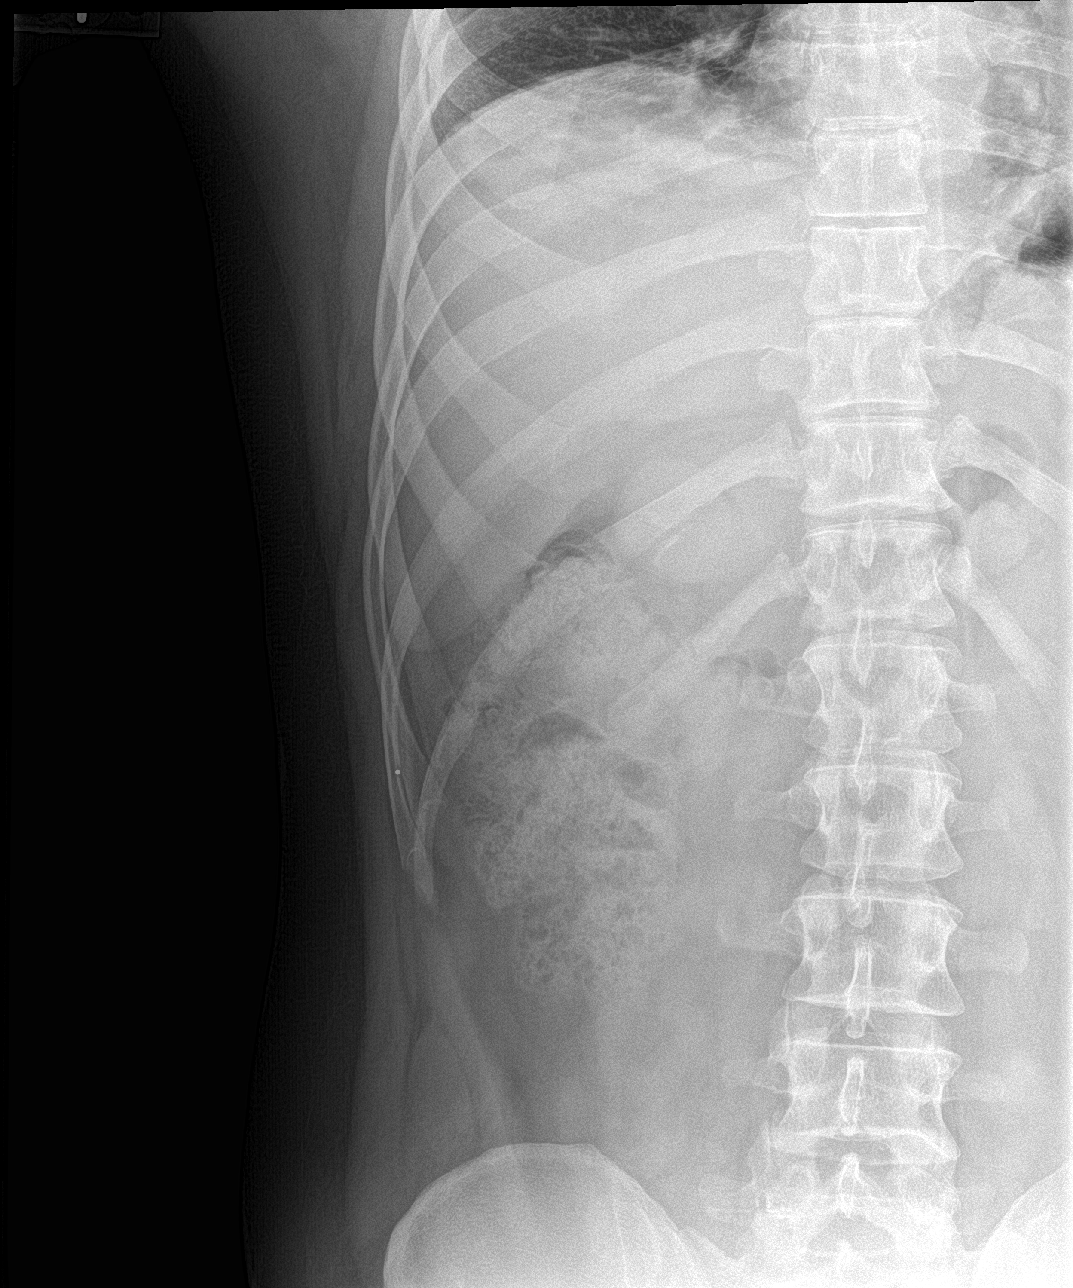

[3 of 3 positions shown; findings below may reference images not displayed]

FINDINGS: No fracture or other bone lesions are seen involving the ribs. There
is no evidence of pneumothorax or pleural effusion. Linear opacities
at lung bases compatible with minor atelectasis. Normal cardiac
silhouette.
IMPRESSION: 1. Bibasilar minor atelectasis.
2. No acute fracture identified.

By: Egberth Nederlof M.D.

## 2022-03-18 ENCOUNTER — Encounter: Payer: Self-pay | Admitting: Sports Medicine

## 2022-03-18 ENCOUNTER — Ambulatory Visit (INDEPENDENT_AMBULATORY_CARE_PROVIDER_SITE_OTHER): Payer: No Typology Code available for payment source | Admitting: Sports Medicine

## 2022-03-18 DIAGNOSIS — M546 Pain in thoracic spine: Secondary | ICD-10-CM

## 2022-03-18 DIAGNOSIS — M5412 Radiculopathy, cervical region: Secondary | ICD-10-CM | POA: Diagnosis not present

## 2022-03-18 MED ORDER — PREDNISONE 50 MG PO TABS: ORAL_TABLET | ORAL | 0 refills | Status: AC

## 2022-03-18 MED ORDER — CYCLOBENZAPRINE HCL 10 MG PO TABS: ORAL_TABLET | ORAL | 3 refills | Status: AC

## 2022-03-18 NOTE — Progress Notes (Signed)
    Procedures performed today:    None.  Independent interpretation of notes and tests performed by another provider:   None.  Brief History, Exam, Impression, and Recommendations:    Left cervical radiculopathy Pleasant 41 year old male, does jujitsu, increasing pain neck with radiation down the left arm with mild paresthesias into the index finger. No red flag symptoms, adding prednisone, Flexeril, home conditioning, return if needed in 6 weeks and we can proceed with additional imaging if needed.    ____________________________________________ Ihor Austin. Benjamin Stain, M.D., ABFM., CAQSM., AME. Primary Care and Sports Medicine [facility]  Adjunct Professor of Family Medicine  [employer]  Restaurant manager, [employer]

## 2022-03-18 NOTE — Assessment & Plan Note (Signed)
Pleasant 41 year old male, does jujitsu, increasing pain neck with radiation down the left arm with mild paresthesias into the index finger. No red flag symptoms, adding prednisone, Flexeril, home conditioning, return if needed in 6 weeks and we can proceed with additional imaging if needed.

## 2024-05-21 ENCOUNTER — Encounter: Payer: Self-pay | Admitting: Sports Medicine

## 2024-07-12 ENCOUNTER — Other Ambulatory Visit (HOSPITAL_COMMUNITY): Payer: Self-pay

## 2024-10-01 ENCOUNTER — Encounter: Admitting: Urgent Care
